# Patient Record
Sex: Female | Born: 1980 | Race: White | Hispanic: No | Marital: Married | Smoking: Never smoker
Health system: Southern US, Community
[De-identification: ages and names within clinical notes are randomized; demographics above are authoritative.]

## PROBLEM LIST (undated history)

## (undated) DIAGNOSIS — I10 Essential (primary) hypertension: Secondary | ICD-10-CM

## (undated) DIAGNOSIS — IMO0002 Reserved for concepts with insufficient information to code with codable children: Secondary | ICD-10-CM

## (undated) DIAGNOSIS — A630 Anogenital (venereal) warts: Secondary | ICD-10-CM

## (undated) HISTORY — DX: Anogenital (venereal) warts: A63.0

## (undated) HISTORY — DX: Reserved for concepts with insufficient information to code with codable children: IMO0002

## (undated) HISTORY — PX: COLPOSCOPY: SHX161

## (undated) HISTORY — DX: Essential (primary) hypertension: I10

---

## 2000-06-03 HISTORY — PX: TONSILLECTOMY: SHX5217

## 2003-12-16 ENCOUNTER — Other Ambulatory Visit: Admission: RE | Admit: 2003-12-16 | Discharge: 2003-12-16 | Payer: Self-pay | Admitting: Obstetrics and Gynecology

## 2005-01-24 ENCOUNTER — Other Ambulatory Visit: Admission: RE | Admit: 2005-01-24 | Discharge: 2005-01-24 | Payer: Self-pay | Admitting: Obstetrics and Gynecology

## 2005-06-03 DIAGNOSIS — IMO0002 Reserved for concepts with insufficient information to code with codable children: Secondary | ICD-10-CM

## 2005-06-03 HISTORY — DX: Reserved for concepts with insufficient information to code with codable children: IMO0002

## 2006-01-27 ENCOUNTER — Other Ambulatory Visit: Admission: RE | Admit: 2006-01-27 | Discharge: 2006-01-27 | Payer: Self-pay | Admitting: Obstetrics and Gynecology

## 2006-05-14 ENCOUNTER — Emergency Department (HOSPITAL_COMMUNITY): Admission: EM | Admit: 2006-05-14 | Discharge: 2006-05-14 | Payer: Self-pay | Admitting: *Deleted

## 2006-08-11 ENCOUNTER — Other Ambulatory Visit: Admission: RE | Admit: 2006-08-11 | Discharge: 2006-08-11 | Payer: Self-pay | Admitting: Obstetrics & Gynecology

## 2007-02-17 ENCOUNTER — Other Ambulatory Visit: Admission: RE | Admit: 2007-02-17 | Discharge: 2007-02-17 | Payer: Self-pay | Admitting: Obstetrics and Gynecology

## 2007-08-06 ENCOUNTER — Other Ambulatory Visit: Admission: RE | Admit: 2007-08-06 | Discharge: 2007-08-06 | Payer: Self-pay | Admitting: Obstetrics and Gynecology

## 2008-02-18 ENCOUNTER — Other Ambulatory Visit: Admission: RE | Admit: 2008-02-18 | Discharge: 2008-02-18 | Payer: Self-pay | Admitting: Obstetrics and Gynecology

## 2012-05-03 HISTORY — PX: INTRAUTERINE DEVICE (IUD) INSERTION: SHX5877

## 2013-05-24 ENCOUNTER — Encounter: Payer: Self-pay | Admitting: Nurse Practitioner

## 2013-05-24 ENCOUNTER — Ambulatory Visit (INDEPENDENT_AMBULATORY_CARE_PROVIDER_SITE_OTHER): Payer: BC Managed Care – PPO | Admitting: Nurse Practitioner

## 2013-05-24 VITALS — BP 122/80 | HR 64 | Resp 16 | Ht 65.25 in | Wt 185.0 lb

## 2013-05-24 DIAGNOSIS — Z0000 Encounter for general adult medical examination without abnormal findings: Secondary | ICD-10-CM

## 2013-05-24 DIAGNOSIS — Z01419 Encounter for gynecological examination (general) (routine) without abnormal findings: Secondary | ICD-10-CM

## 2013-05-24 LAB — COMPREHENSIVE METABOLIC PANEL
ALT: 19 U/L (ref 0–35)
AST: 18 U/L (ref 0–37)
Alkaline Phosphatase: 75 U/L (ref 39–117)
BUN: 10 mg/dL (ref 6–23)
Calcium: 9.3 mg/dL (ref 8.4–10.5)
Creat: 0.77 mg/dL (ref 0.50–1.10)
Glucose, Bld: 93 mg/dL (ref 70–99)

## 2013-05-24 NOTE — Patient Instructions (Signed)

## 2013-05-24 NOTE — Progress Notes (Signed)
Patient ID: Jane Shaffer, female   DOB: 1981-05-07, 32 y.o.   MRN: [id number] 32 y.o. [id number] Married Caucasian Fe here for annual exam. Last GYN gave her the med's for HTN.  She is having very light spotting at times since Mirena IUD last December.  She was a patient here of Dr. Candis Musa then went to [facility] to have her children.  She has a daughter who is 4 and during that pregnancy was started on HTN treatment.  After that pregnancy had to continue with BP med's. She then got pregnant again and has a son that is now 60 1/2 years old.  He has a lot of developmental delays and he is being studied at different places because he does not fit into any one criteria - closest to CP.  He has a trache and requires 24/7 care.    Patient's last menstrual period was 05/20/2013.          Sexually active: yes  The current method of family planning is IUD - Mirena Exercising: yes  Home exercise routine includes jogging 4-5 times per week. Smoker:  no  Health Maintenance: Pap:  2012, WNL.  History of abnormal with HPV, but no abnormal pap in last 6-7 years.  Abnormal records in our old chart. TDaP:  2009 Labs: CMP   reports that she has never smoked. She has never used smokeless tobacco. She reports that she drinks about 1.0 ounces of alcohol per week. She reports that she does not use illicit drugs.  Past Medical History  Diagnosis Date  . Hypertension   . Genital warts   . Abnormal Pap smear     with HPV, none in last 6-7 years    Past Surgical History  Procedure Laterality Date  . Colposcopy    . Tonsillectomy  2002    Current Outpatient Prescriptions  Medication Sig Dispense Refill  . benzonatate (TESSALON) 100 MG capsule Take 1 capsule by mouth as needed. For cough      . Cholecalciferol (VITAMIN D) 2000 UNITS CAPS Take 1 capsule by mouth daily.      Marland Kitchen lisinopril-hydrochlorothiazide (PRINZIDE,ZESTORETIC) 10-12.5 MG per tablet Take 1 tablet by mouth daily.       No current  facility-administered medications for this visit.    Family History  Problem Relation Age of Onset  . Diabetes Father 56    Juvenile  . Diabetes Paternal Uncle 3    juvenile  . Breast cancer Paternal Grandmother     60's or 70's    ROS:  Pertinent items are noted in HPI.  Otherwise, a comprehensive ROS was negative.  Exam:   BP 122/80  Pulse 64  Resp 16  Ht 5' 5.25" (1.657 m)  Wt 185 lb (83.915 kg)  BMI 30.56 kg/m2  LMP 05/20/2013 Height: 5' 5.25" (165.7 cm)  Ht Readings from Last 3 Encounters:  05/24/13 5' 5.25" (1.657 m)    General appearance: alert, cooperative and appears stated age Head: Normocephalic, without obvious abnormality, atraumatic Neck: no adenopathy, supple, symmetrical, trachea midline and thyroid normal to inspection and palpation Lungs: clear to auscultation bilaterally Breasts: normal appearance, no masses or tenderness Heart: regular rate and rhythm Abdomen: soft, non-tender; no masses,  no organomegaly Extremities: extremities normal, atraumatic, no cyanosis or edema Skin: Skin color, texture, turgor normal. No rashes or lesions Lymph nodes: Cervical, supraclavicular, and axillary nodes normal. No abnormal inguinal nodes palpated Neurologic: Grossly normal   Pelvic: External genitalia:  no lesions  Urethra:  normal appearing urethra with no masses, tenderness or lesions              Bartholin's and Skene's: normal                 Vagina: normal appearing vagina with normal color and discharge, no lesions              Cervix: anteverted IUD strings are visible              Pap taken: yes Bimanual Exam:  Uterus:  normal size, contour, position, consistency, mobility, non-tender              Adnexa: no mass, fullness, tenderness               Rectovaginal: Confirms               Anus:  normal sphincter tone, no lesions  A:  Well Woman with normal exam  Mirena IUD for contraception  Previous history of CIN I about 7 years  ago  HTN  P:   Pap smear as per guidelines done today   Counseled on breast self exam, adequate intake of calcium and vitamin D, diet and exercise return annually or prn  An After Visit Summary was printed and given to the patient.

## 2013-05-26 NOTE — Progress Notes (Signed)
Reviewed personally.  M. Suzanne Keirstyn Aydt, MD.  

## 2013-06-02 ENCOUNTER — Encounter: Payer: Self-pay | Admitting: Nurse Practitioner

## 2014-04-04 ENCOUNTER — Encounter: Payer: Self-pay | Admitting: Nurse Practitioner

## 2016-09-30 ENCOUNTER — Ambulatory Visit (INDEPENDENT_AMBULATORY_CARE_PROVIDER_SITE_OTHER): Payer: BC Managed Care – PPO | Admitting: Nurse Practitioner

## 2016-09-30 ENCOUNTER — Encounter: Payer: Self-pay | Admitting: Nurse Practitioner

## 2016-09-30 VITALS — BP 126/84 | HR 80 | Ht 65.0 in | Wt 169.0 lb

## 2016-09-30 DIAGNOSIS — Z01411 Encounter for gynecological examination (general) (routine) with abnormal findings: Secondary | ICD-10-CM | POA: Diagnosis not present

## 2016-09-30 DIAGNOSIS — Z975 Presence of (intrauterine) contraceptive device: Secondary | ICD-10-CM | POA: Diagnosis not present

## 2016-09-30 DIAGNOSIS — T8332XA Displacement of intrauterine contraceptive device, initial encounter: Secondary | ICD-10-CM

## 2016-09-30 DIAGNOSIS — Z0000 Encounter for general adult medical examination without abnormal findings: Secondary | ICD-10-CM

## 2016-09-30 NOTE — Progress Notes (Signed)
36 y.o. [id number] Married  Caucasian Fe here for annual exam.  She is a former pt just over 3 yrs since last seen.  Menses now at 2 days of spotting monthly while having the Mirena IUD.Marland Kitchen No PMS, slight bloating, no cramps. Wants another Mirena IUD after this one.  She continues to teach high school.  Update about son:  Hospital in [place], [place] has found that her son symptoms fits closest to CP. He has suffered a fracture of the femur.  Still has tracheostomy.  He had a bad infection after the fracture and became septic.  Muscle movements are spastic and does not talk. Still requires 24/7 care.  He is able to be in school with a nurse present.  Note from 05/24/2013:  (Last GYN gave her the med's for HTN.  She is having very light spotting at times since Mirena IUD last December.  She was a patient here of Dr. Candis Musa then went to [facility] to have her children.  She has a daughter who is 4 and during that pregnancy was started on HTN treatment.  After that pregnancy had to continue with BP med's. She then got pregnant again and has a son that is now 17 1/2 years old.  He has a lot of developmental delays and he is being studied at different places because he does not fit into any one criteria - closest to CP.  He has a trache and requires 24/7 care. )    Patient's last menstrual period was 09/29/2016 (exact date).          Sexually active: Yes.    The current method of family planning is IUD. Mirena placed 05/2012. Exercising: Yes.    yoga, running and pilates. Smoker:  no  Health Maintenance: Pap: 05/24/13, Negative with neg HR HPV  2012, Negative History of Abnormal Pap: yes, History of abnormal with HPV, but no abnormal pap since 2007.  Abnormal records in paper chart. Self Breast exams: yes TDaP: 2009 HIV: 2010 with pregnancy Labs: PCP takes care of all labs   reports that she has never smoked. She has never used smokeless tobacco. She reports that she drinks about 1.0 oz of alcohol per week . She  reports that she does not use drugs.  Past Medical History:  Diagnosis Date  . 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 Genital warts   . Hypertension     Past Surgical History:  Procedure Laterality Date  . COLPOSCOPY    . INTRAUTERINE DEVICE (IUD) INSERTION  05/2012   Mirena  . TONSILLECTOMY  2002    Current Outpatient Prescriptions  Medication Sig Dispense Refill  . Cholecalciferol (VITAMIN D) 2000 UNITS CAPS Take 1 capsule by mouth daily.    Marland Kitchen levonorgestrel (MIRENA) 20 MCG/24HR IUD by Intrauterine route.    Marland Kitchen lisinopril-hydrochlorothiazide (PRINZIDE,ZESTORETIC) 10-12.5 MG per tablet Take 1 tablet by mouth daily.     No current facility-administered medications for this visit.     Family History  Problem Relation Age of Onset  . Diabetes Father 34    Juvenile  . Hyperlipidemia Father   . Hypertension Father   . Breast cancer Paternal Grandmother     10's or 56's  . Hyperlipidemia Brother   . Hypertension Brother   . Hyperlipidemia Brother   . Diabetes Paternal Uncle 3    juvenile    ROS:  Pertinent items are noted in HPI.  Otherwise, a comprehensive ROS was negative.  Exam:  BP 126/84 (BP Location: Right Arm, Patient Position: Sitting, Cuff Size: Normal)   Pulse 80   Ht  (1.651 m)   Wt 169 lb (76.7 kg)   LMP 09/29/2016 (Exact Date) Comment: spotting x 1 day  BMI 28.12 kg/m  Height:  (165.1 cm) Ht Readings from Last 3 Encounters:  09/30/16  (1.651 m)  05/24/13 5' 5.25" (1.657 m)    General appearance: alert, cooperative and appears stated age Head: Normocephalic, without obvious abnormality, atraumatic Neck: no adenopathy, supple, symmetrical, trachea midline and thyroid normal to inspection and palpation Lungs: clear to auscultation bilaterally Breasts: normal appearance, no masses or tenderness Heart: regular rate and rhythm Abdomen: soft, non-tender; no masses,  no organomegaly Extremities: extremities normal, atraumatic, no cyanosis  or edema Skin: Skin color, texture, turgor normal. No rashes or lesions Lymph nodes: Cervical, supraclavicular, and axillary nodes normal. No abnormal inguinal nodes palpated Neurologic: Grossly normal   Pelvic: External genitalia:  no lesions              Urethra:  normal appearing urethra with no masses, tenderness or lesions              Bartholin's and Skene's: normal                 Vagina: normal appearing vagina with normal color and discharge, no lesions              Cervix: anteverted              Pap taken: Yes.   Bimanual Exam:  Uterus:  normal size, contour, position, consistency, mobility, non-tender              Adnexa: no mass, fullness, tenderness               Rectovaginal: Confirms               Anus:  normal sphincter tone, no lesions  Chaperone present: yes  A:  Well Woman with normal exam   Mirena IUD for contraception - unable to find IUD strings             Previous history of CIN I remote past ~ 2007             HTN  Son with developmental problems and requires 24/7 care   P:   Reviewed health and wellness pertinent to exam  Pap smear: yes  Will get PUS to look for IUD strings - may end up changing IUD earlier  Counseled on breast self exam, adequate intake of calcium and vitamin D, diet and exercise return annually or prn  An After Visit Summary was printed and given to the patient.

## 2016-09-30 NOTE — Patient Instructions (Signed)

## 2016-09-30 NOTE — Progress Notes (Signed)
Encounter reviewed by Dr. Brook Amundson C. Silva.  

## 2016-10-01 NOTE — Addendum Note (Signed)
Addended by: Ria Comment R on: 10/01/2016 04:14 PM   Modules accepted: Orders

## 2016-10-02 LAB — IPS PAP TEST WITH HPV

## 2016-11-15 ENCOUNTER — Telehealth: Payer: Self-pay | Admitting: Nurse Practitioner

## 2016-11-15 NOTE — Telephone Encounter (Signed)
Called patient to review benefits for a recommended procedure. Left Voicemail requesting a call back. °

## 2016-12-09 NOTE — Telephone Encounter (Signed)
Thank you for the update. You may close the encounter. 

## 2016-12-09 NOTE — Telephone Encounter (Signed)
Patient returned call. Acknowledges previous message. Patient ready to proceed with scheduling ultrasound for IUD removal and reinsertion of Mirena IUD.  Patient scheduled 7/12 with Dr Edward JollySilva. Patient aware of date, arrival time and cancellation policy. No further questions.   Routing to Dr Edward JollySilva

## 2016-12-18 ENCOUNTER — Other Ambulatory Visit: Payer: Self-pay

## 2016-12-18 DIAGNOSIS — Z30433 Encounter for removal and reinsertion of intrauterine contraceptive device: Secondary | ICD-10-CM

## 2016-12-19 ENCOUNTER — Encounter: Payer: Self-pay | Admitting: Obstetrics and Gynecology

## 2016-12-19 ENCOUNTER — Ambulatory Visit (INDEPENDENT_AMBULATORY_CARE_PROVIDER_SITE_OTHER): Payer: BC Managed Care – PPO | Admitting: Obstetrics and Gynecology

## 2016-12-19 ENCOUNTER — Ambulatory Visit (INDEPENDENT_AMBULATORY_CARE_PROVIDER_SITE_OTHER): Payer: BC Managed Care – PPO

## 2016-12-19 DIAGNOSIS — Z30433 Encounter for removal and reinsertion of intrauterine contraceptive device: Secondary | ICD-10-CM

## 2016-12-19 NOTE — Patient Instructions (Signed)

## 2016-12-19 NOTE — Progress Notes (Signed)
GYNECOLOGY  VISIT   HPI: 36 y.o.   Married  Caucasian  female   [phone] with Patient's last menstrual period was 12/04/2016 (approximate).   here for   Ultrasound for IUD location and removal/reinsertion of new Mirena IUD.   This IUD is not expired yet.  No cycles for the last 5 years.  Using Mirena for pregnancy prevention.   GYNECOLOGIC HISTORY: Patient's last menstrual period was 12/04/2016 (approximate). Contraception:  Mirena.  Menopausal hormone therapy: NA Last mammogram:  NA Last pap smear:  10/01/16 - normal and neg HR HPV.         OB History    Gravida Para Term Preterm AB Living   3 2 2  0 1 2   SAB TAB Ectopic Multiple Live Births   1 0 0 0 2         There are no active problems to display for this patient.   Past Medical History:  Diagnosis Date  . 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 Genital warts   . Hypertension     Past Surgical History:  Procedure Laterality Date  . COLPOSCOPY    . INTRAUTERINE DEVICE (IUD) INSERTION  05/2012   Mirena  . TONSILLECTOMY  2002    Current Outpatient Prescriptions  Medication Sig Dispense Refill  . Cholecalciferol (VITAMIN D) 2000 UNITS CAPS Take 1 capsule by mouth daily.    Marland Kitchen. levonorgestrel (MIRENA) 20 MCG/24HR IUD by Intrauterine route.    Marland Kitchen. lisinopril-hydrochlorothiazide (PRINZIDE,ZESTORETIC) 10-12.5 MG per tablet Take 1 tablet by mouth daily.     No current facility-administered medications for this visit.      ALLERGIES: Patient has no known allergies.  Family History  Problem Relation Age of Onset  . Diabetes Father 6512       Juvenile  . Hyperlipidemia Father   . Hypertension Father   . Breast cancer Paternal Grandmother        460's or 6270's  . Hyperlipidemia Brother   . Hypertension Brother   . Hyperlipidemia Brother   . Diabetes Paternal Uncle 3       juvenile    Social History   Social History  . Marital status: Married    Spouse name: N/A  . Number of children: 2  . Years of education:  N/A   Occupational History  . Not on file.   Social History Main Topics  . Smoking status: Never Smoker  . Smokeless tobacco: Never Used  . Alcohol use 1.0 oz/week    2 Standard drinks or equivalent per week  . Drug use: No  . Sexual activity: Yes    Partners: Male    Birth control/ protection: IUD     Comment: Mirena inserted 12/19/17   Other Topics Concern  . Not on file   Social History Narrative  . No narrative on file    ROS:  Pertinent items are noted in HPI.  PHYSICAL EXAMINATION:    BP (!) 100/56 (BP Location: Right Arm, Patient Position: Sitting, Cuff Size: Normal)   Pulse 74   Resp 12   Ht 5\' 5"  (1.651 m)   Wt 173 lb (78.5 kg)   LMP 12/04/2016 (Approximate)   BMI 28.79 kg/m     General appearance: alert, cooperative and appears stated age   Pelvic: External genitalia:  no lesions              Urethra:  normal appearing urethra with no masses, tenderness or lesions  Bartholins and Skenes: normal                 Vagina: normal appearing vagina with normal color and discharge, no lesions              Cervix: no lesions                Bimanual Exam:  Uterus:  normal size, contour, position, consistency, mobility, non-tender              Adnexa: no mass, fullness, tenderness       Ultrasound today: IUD in endometrial canal.   Consent for IUD removal and reinsertion of new Mirena IUD.     Mirena IUD lot number [id number], expiration Jan 2021. Sterile prep with Hibiclens.  Tenaculum to anterior cervical lip.  Dressing forceps used to remove IUD intact, shown to patient, and discarded.  Uterus sounded to 8.5 cm.  Felt like it went into the right uterine horn.  No blood noted.  Mirena IUD placed to 8 cm.  Strings trimmed to 2 cm.  Pelvic US confirms position afterward. Tolerated well. No complications.   Chaperone was present for exam.  ASSESSMENT  Mirena IUD removal and reinsertion with US guidance.   PLAN  Use back up contraception for  1 week.  Instructions and precautions given.  Follow up in 4 weeks.   An After Visit Summary was printed and given to the patient.

## 2017-01-16 ENCOUNTER — Telehealth: Payer: Self-pay | Admitting: *Deleted

## 2017-01-16 NOTE — Progress Notes (Deleted)
GYNECOLOGY  VISIT   HPI: 36 y.o.   Married  Caucasian  female   [phone] with No LMP recorded. Patient is not currently having periods (Reason: IUD).   here for follow up after Mirena IUD insertion.   GYNECOLOGIC HISTORY: No LMP recorded. Patient is not currently having periods (Reason: IUD). Contraception:  Mirena IUD inserted 12-19-16 Menopausal hormone therapy:  n/a Last mammogram:  n/a Last pap smear: 10/01/16 - normal and neg HR HPV.         OB History    Gravida Para Term Preterm AB Living   3 2 2  0 1 2   SAB TAB Ectopic Multiple Live Births   1 0 0 0 2         There are no active problems to display for this patient.   Past Medical History:  Diagnosis Date  . 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 Genital warts   . Hypertension     Past Surgical History:  Procedure Laterality Date  . COLPOSCOPY    . INTRAUTERINE DEVICE (IUD) INSERTION  05/2012   Mirena  . TONSILLECTOMY  2002    Current Outpatient Prescriptions  Medication Sig Dispense Refill  . Cholecalciferol (VITAMIN D) 2000 UNITS CAPS Take 1 capsule by mouth daily.    Marland Kitchen. levonorgestrel (MIRENA) 20 MCG/24HR IUD by Intrauterine route.    Marland Kitchen. lisinopril-hydrochlorothiazide (PRINZIDE,ZESTORETIC) 10-12.5 MG per tablet Take 1 tablet by mouth daily.     No current facility-administered medications for this visit.      ALLERGIES: Patient has no known allergies.  Family History  Problem Relation Age of Onset  . Diabetes Father 6812       Juvenile  . Hyperlipidemia Father   . Hypertension Father   . Breast cancer Paternal Grandmother        4860's or 9170's  . Hyperlipidemia Brother   . Hypertension Brother   . Hyperlipidemia Brother   . Diabetes Paternal Uncle 3       juvenile    Social History   Social History  . Marital status: Married    Spouse name: N/A  . Number of children: 2  . Years of education: N/A   Occupational History  . Not on file.   Social History Main Topics  . Smoking status: Never  Smoker  . Smokeless tobacco: Never Used  . Alcohol use 1.0 oz/week    2 Standard drinks or equivalent per week  . Drug use: No  . Sexual activity: Yes    Partners: Male    Birth control/ protection: IUD     Comment: Mirena inserted 12/19/17   Other Topics Concern  . Not on file   Social History Narrative  . No narrative on file    ROS:  Pertinent items are noted in HPI.  PHYSICAL EXAMINATION:    There were no vitals taken for this visit.    General appearance: alert, cooperative and appears stated age Head: Normocephalic, without obvious abnormality, atraumatic Neck: no adenopathy, supple, symmetrical, trachea midline and thyroid normal to inspection and palpation Lungs: clear to auscultation bilaterally Breasts: normal appearance, no masses or tenderness, No nipple retraction or dimpling, No nipple discharge or bleeding, No axillary or supraclavicular adenopathy Heart: regular rate and rhythm Abdomen: soft, non-tender, no masses,  no organomegaly Extremities: extremities normal, atraumatic, no cyanosis or edema Skin: Skin color, texture, turgor normal. No rashes or lesions Lymph nodes: Cervical, supraclavicular, and axillary nodes normal. No abnormal inguinal  nodes palpated Neurologic: Grossly normal  Pelvic: External genitalia:  no lesions              Urethra:  normal appearing urethra with no masses, tenderness or lesions              Bartholins and Skenes: normal                 Vagina: normal appearing vagina with normal color and discharge, no lesions              Cervix: no lesions                Bimanual Exam:  Uterus:  normal size, contour, position, consistency, mobility, non-tender              Adnexa: no mass, fullness, tenderness              Rectal exam: {yes no:314532}.  Confirms.              Anus:  normal sphincter tone, no lesions  Chaperone was present for exam.  ASSESSMENT     PLAN     An After Visit Summary was printed and given to the  patient.  ______ minutes face to face time of which over 50% was spent in counseling.

## 2017-01-16 NOTE — Telephone Encounter (Signed)
Call to patient. Left message to cancel appointment in am due to provider family emergency. Left message to call back in am to reschedule.

## 2017-01-17 ENCOUNTER — Ambulatory Visit: Payer: BC Managed Care – PPO | Admitting: Obstetrics and Gynecology

## 2017-01-21 ENCOUNTER — Encounter: Payer: Self-pay | Admitting: Obstetrics and Gynecology

## 2017-01-21 ENCOUNTER — Ambulatory Visit (INDEPENDENT_AMBULATORY_CARE_PROVIDER_SITE_OTHER): Payer: BC Managed Care – PPO | Admitting: Obstetrics and Gynecology

## 2017-01-21 VITALS — BP 138/76 | HR 92 | Resp 16 | Ht 65.0 in | Wt 175.0 lb

## 2017-01-21 DIAGNOSIS — Z30431 Encounter for routine checking of intrauterine contraceptive device: Secondary | ICD-10-CM | POA: Diagnosis not present

## 2017-01-21 NOTE — Progress Notes (Signed)
GYNECOLOGY  VISIT   HPI: 36 y.o.   Married  Caucasian  female   [phone] with No LMP recorded. Patient is not currently having periods (Reason: IUD).   here for 1 month IUD check.  Had Mirena IUD exchange on 12/19/16. Really likes the IUD.  No bleeding or cramping.  Sexual activity is not painful.  Has not checked her IUD strings.   The last time she had the Mirena, the strings got too short.   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   GYNECOLOGIC HISTORY: No LMP recorded. Patient is not currently having periods (Reason: IUD). Contraception:  Mirena IUD Menopausal hormone therapy:  none Last mammogram:  none Last pap smear:   11/01/16 Neg. HR HPV:neg         OB History    Gravida Para Term Preterm AB Living   3 2 2  0 1 2   SAB TAB Ectopic Multiple Live Births   1 0 0 0 2         There are no active problems to display for this patient.   Past Medical History:  Diagnosis Date  . 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 Genital warts   . Hypertension     Past Surgical History:  Procedure Laterality Date  . COLPOSCOPY    . INTRAUTERINE DEVICE (IUD) INSERTION  05/2012   Mirena  . TONSILLECTOMY  2002    Current Outpatient Prescriptions  Medication Sig Dispense Refill  . Cholecalciferol (VITAMIN D) 2000 UNITS CAPS Take 1 capsule by mouth daily.    Marland Kitchen levonorgestrel (MIRENA) 20 MCG/24HR IUD by Intrauterine route.    Marland Kitchen lisinopril-hydrochlorothiazide (PRINZIDE,ZESTORETIC) 10-12.5 MG per tablet Take 1 tablet by mouth daily.     No current facility-administered medications for this visit.      ALLERGIES: Patient has no known allergies.  Family History  Problem Relation Age of Onset  . Diabetes Father 75       Juvenile  . Hyperlipidemia Father   . Hypertension Father   . Breast cancer Paternal Grandmother        25's or 24's  . Hyperlipidemia Brother   . Hypertension Brother   . Hyperlipidemia Brother   . Diabetes Paternal Uncle  3       juvenile    Social History   Social History  . Marital status: Married    Spouse name: N/A  . Number of children: 2  . Years of education: N/A   Occupational History  . Not on file.   Social History Main Topics  . Smoking status: Never Smoker  . Smokeless tobacco: Never Used  . Alcohol use 1.0 oz/week    2 Standard drinks or equivalent per week  . Drug use: No  . Sexual activity: Yes    Partners: Male    Birth control/ protection: IUD     Comment: Mirena inserted 12/19/17   Other Topics Concern  . Not on file   Social History Narrative  . No narrative on file    ROS:  Pertinent items are noted in HPI.  PHYSICAL EXAMINATION:    BP 138/76 (BP Location: Right Arm, Patient Position: Sitting, Cuff Size: Normal)   Pulse 92   Resp 16   Ht 5\' 5"  (1.651 m)   Wt 175 lb (79.4 kg)   BMI 29.12 kg/m     General appearance: alert, cooperative and appears stated age  Pelvic: External genitalia:  no lesions              Urethra:  normal appearing urethra with no masses, tenderness or lesions              Bartholins and Skenes: normal                 Vagina: normal appearing vagina with normal color and discharge, no lesions              Cervix: no lesions.  IUD strings seen.  About 2.5 - 3 cm length.                Bimanual Exam:  Uterus:  normal size, contour, position, consistency, mobility, non-tender              Adnexa: no mass, fullness, tenderness             Chaperone was present for exam.  ASSESSMENT  Status post Mirena IUD exchange.  Doing well.   PLAN  Follow up for routine annual exams and prn.  Good luck with school starting next week!   An After Visit Summary was printed and given to the patient.  __15____ minutes face to face time of which over 50% was spent in counseling.

## 2017-01-27 NOTE — Telephone Encounter (Signed)
Appointment completed on 01-21-17.  Routing to provider for final review.  Will close encounter.

## 2017-10-03 ENCOUNTER — Ambulatory Visit: Payer: BC Managed Care – PPO | Admitting: Nurse Practitioner

## 2017-10-10 ENCOUNTER — Ambulatory Visit: Payer: BC Managed Care – PPO | Admitting: Obstetrics and Gynecology

## 2019-11-10 ENCOUNTER — Other Ambulatory Visit: Payer: Self-pay

## 2019-11-11 ENCOUNTER — Encounter: Payer: Self-pay | Admitting: Obstetrics and Gynecology

## 2019-11-11 ENCOUNTER — Ambulatory Visit: Payer: BC Managed Care – PPO | Admitting: Obstetrics and Gynecology

## 2019-11-11 VITALS — BP 140/76 | HR 90 | Temp 97.7°F | Resp 14 | Ht 65.5 in | Wt 189.0 lb

## 2019-11-11 DIAGNOSIS — Z01419 Encounter for gynecological examination (general) (routine) without abnormal findings: Secondary | ICD-10-CM

## 2019-11-11 NOTE — Patient Instructions (Signed)

## 2019-11-11 NOTE — Progress Notes (Signed)
39 y.o. G69P2012 Married Caucasian female here for annual exam.    No menses with Mirena.  Has spotting every other month.  Feels hot at night.  Takes Melatonin to help with sleep.   Has a 71 yo son who requires 24 hour care at home.  He has Joellyn Quails Syndrome. Her daughter is 66 yo. 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   She received her Covid vaccine.   PCP:   Daylene Katayama, PA  No LMP recorded. (Menstrual status: IUD).     Period Cycle (Days):  (no cycles with IUD)      Sexually active: Yes.    The current method of family planning is IUD--Mirena IUD 12-19-16    Exercising: Yes.    work out videos, walking,jogging Smoker:  no  Health Maintenance: Pap: 10-01-16 Neg:Neg HR HPV, 05-24-13 Neg:Neg HR HPV, 2012 Neg History of abnormal Pap:  Yes, History of abnormal with HPV, but no abnormal pap since 2007.  Abnormal records in paper chart. MMG:  n/a Colonoscopy:  2018 normal--brother with polyps;next due 5 years BMD:   n/a  Result  n/a TDaP: Td 06-25-17 Gardasil:   yes HIV: Neg with preg.2010 Hep C:  Donated blood in the past.  Screening Labs:  PCP.   reports that she has never smoked. She has never used smokeless tobacco. She reports current alcohol use of about 2.0 standard drinks of alcohol per week. She reports that she does not use drugs.  Past Medical History:  Diagnosis Date  . 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 Genital warts   . Hypertension     Past Surgical History:  Procedure Laterality Date  . COLPOSCOPY    . INTRAUTERINE DEVICE (IUD) INSERTION  05/2012   Mirena  . TONSILLECTOMY  2002    Current Outpatient Medications  Medication Sig Dispense Refill  . Cholecalciferol (VITAMIN D) 2000 UNITS CAPS Take 1 capsule by mouth daily.    Marland Kitchen levonorgestrel (MIRENA) 20 MCG/24HR IUD by Intrauterine route.    Marland Kitchen lisinopril-hydrochlorothiazide (PRINZIDE,ZESTORETIC) 10-12.5 MG per tablet Take 1 tablet by mouth daily.     No current facility-administered medications  for this visit.    Family History  Problem Relation Age of Onset  . Diabetes Father 30       Juvenile  . Hyperlipidemia Father   . Hypertension Father   . Breast cancer Paternal Grandmother        19's or 35's  . Hyperlipidemia Brother   . Hypertension Brother   . Hyperlipidemia Brother   . Diabetes Paternal Uncle 3       juvenile    Review of Systems  All other systems reviewed and are negative.   Exam:   BP 140/76   Pulse 90   Temp 97.7 F (36.5 C) (Temporal)   Resp 14   Ht 5' 5.5" (1.664 m)   Wt 189 lb (85.7 kg)   BMI 30.97 kg/m     General appearance: alert, cooperative and appears stated age Head: normocephalic, without obvious abnormality, atraumatic Neck: no adenopathy, supple, symmetrical, trachea midline and thyroid normal to inspection and palpation Lungs: clear to auscultation bilaterally Breasts: normal appearance, no masses or tenderness, No nipple retraction or dimpling, No nipple discharge or bleeding, No axillary adenopathy Heart: regular rate and rhythm Abdomen: soft, non-tender; no masses, no organomegaly Extremities: extremities normal, atraumatic, no cyanosis or edema Skin: skin color, texture, turgor normal. No rashes or lesions Lymph nodes: cervical, supraclavicular, and  axillary nodes normal. Neurologic: grossly normal  Pelvic: External genitalia:  no lesions              No abnormal inguinal nodes palpated.              Urethra:  normal appearing urethra with no masses, tenderness or lesions              Bartholins and Skenes: normal                 Vagina: normal appearing vagina with normal color and discharge, no lesions              Cervix: no lesions.  IUD strings noted.               Pap taken: No. Bimanual Exam:  Uterus:  normal size, contour, position, consistency, mobility, non-tender              Adnexa: no mass, fullness, tenderness             Chaperone was present for exam.  Assessment:   Well woman visit with normal  exam. Mirena IUD.  Hx CIN I around 2007. Caregiver status.  Plan: Mammogram screening discussed. Self breast awareness reviewed. Pap and HR HPV as above.  New guidelines discussed. Guidelines for Calcium, Vitamin D, regular exercise program including cardiovascular and weight bearing exercise. Discussed colonoscopy. Check FSH, E2, and TSH.  Follow up annually and prn.   After visit summary provided.

## 2019-11-12 LAB — ESTRADIOL: Estradiol: 49.9 pg/mL

## 2019-11-12 LAB — TSH: TSH: 1.14 u[IU]/mL (ref 0.450–4.500)

## 2019-11-12 LAB — FOLLICLE STIMULATING HORMONE: FSH: 6.2 m[IU]/mL

## 2020-10-31 NOTE — Progress Notes (Signed)
40 y.o. G63P2012 Married Caucasian female here for annual exam.    Had a recent UTI and took Macrobid.   Having more menstrual flow now.  Her daughter has started having her period.   Received her Covid booster. Has a special needs son.   PCP:  Fae Pippin, PA  No LMP recorded. (Menstrual status: IUD).           Sexually active: Yes The current method of family planning is IUD- Mirena IUD 12/19/2016  Exercising: Yes, work out videos, walking/jogging Smoker: No  Health Maintenance: Pap:  10-01-16 Neg:Neg HR HPV, 05-24-13 Neg:Neg HR HPV, 2012 Neg  History of abnormal Pap: Yes, History of abnormal with HPV, but no abnormal pap since 2007.  Abnormal records in paper chart. MMG: Not yet Colonoscopy:   2018 normal--brother with polyps;next due 5 years TDaP: 06/15/2017 Gardasil:  Yes HIV: Neg with preg. 2010 Hep C: Screening Labs:  PCP   reports that she has never smoked. She has never used smokeless tobacco. She reports current alcohol use of about 6.0 standard drinks of alcohol per week. She reports that she does not use drugs.  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     levonorgestrel (MIRENA) 20 MCG/24HR IUD by Intrauterine route.     lisinopril-hydrochlorothiazide (PRINZIDE,ZESTORETIC) 10-12.5 MG per tablet Take 1 tablet by mouth daily.     No current facility-administered medications for this visit.    Family History  Problem Relation Age of Onset   Diabetes Father 20       Juvenile   Hyperlipidemia Father    Hypertension Father    Breast cancer Paternal Grandmother        19's or 54's   Hyperlipidemia  Brother    Hypertension Brother    Hyperlipidemia Brother    Diabetes Paternal Uncle 3       juvenile    Review of Systems  Constitutional: Negative.   HENT: Negative.    Eyes: Negative.   Respiratory: Negative.    Cardiovascular: Negative.   Gastrointestinal: Negative.   Endocrine: Negative.   Genitourinary: Negative.   Musculoskeletal: Negative.   Skin: Negative.   Allergic/Immunologic: Negative.   Neurological: Negative.   Hematological: Negative.    Exam:   BP 136/84   Pulse 87   Ht 5\' 5"  (1.651 m)   Wt 190 lb (86.2 kg)   BMI 31.62 kg/m     General appearance: alert, cooperative and appears stated age Head: normocephalic, without obvious abnormality, atraumatic Neck: no adenopathy, supple, symmetrical, trachea midline and thyroid normal to inspection and palpation Lungs: clear to auscultation bilaterally Breasts: normal appearance, no masses or tenderness, No nipple retraction or dimpling, No nipple discharge or bleeding, No axillary adenopathy Heart: regular rate and rhythm Abdomen: soft, non-tender; no masses, no organomegaly Extremities: extremities normal, atraumatic, no cyanosis or edema Skin: skin color, texture, turgor normal. No rashes or lesions Lymph nodes: cervical, supraclavicular, and axillary nodes normal. Neurologic: grossly normal  Pelvic: External genitalia:  no lesions  No abnormal inguinal nodes palpated.              Urethra:  normal appearing urethra with no masses, tenderness or lesions              Bartholins and Skenes: normal                 Vagina: normal appearing vagina with normal color and discharge, no lesions              Cervix: no lesions.  IUD strings noted.               Pap taken: No. Bimanual Exam:  Uterus:  normal size, contour, position, consistency, mobility, non-tender              Adnexa: no mass, fullness, tenderness              Rectal exam: Yes.  .  Confirms.              Anus:  normal sphincter tone, no  lesions  Chaperone was present for exam.  Assessment:   Well woman visit with normal exam. Mirena IUD. Hx CIN I around 2007. FH of colon polyps.  HTN. Caregiver status.  Plan: Mammogram screening discussed.  Information given.  She will schedule.  Self breast awareness reviewed. Pap and HR HPV 2025. Guidelines for Calcium, Vitamin D, regular exercise program including cardiovascular and weight bearing exercise. We discussed Mirena IUD being a 7 year IUD now.   Labs with PCP.  Follow up annually and prn.

## 2020-11-13 ENCOUNTER — Ambulatory Visit (INDEPENDENT_AMBULATORY_CARE_PROVIDER_SITE_OTHER): Payer: BC Managed Care – PPO | Admitting: Obstetrics and Gynecology

## 2020-11-13 ENCOUNTER — Other Ambulatory Visit: Payer: Self-pay

## 2020-11-13 ENCOUNTER — Encounter: Payer: Self-pay | Admitting: Obstetrics and Gynecology

## 2020-11-13 VITALS — BP 136/84 | HR 87 | Ht 65.0 in | Wt 190.0 lb

## 2020-11-13 DIAGNOSIS — Z01419 Encounter for gynecological examination (general) (routine) without abnormal findings: Secondary | ICD-10-CM | POA: Diagnosis not present

## 2020-11-13 NOTE — Patient Instructions (Signed)

## 2020-11-15 ENCOUNTER — Other Ambulatory Visit: Payer: Self-pay | Admitting: Obstetrics and Gynecology

## 2020-11-15 DIAGNOSIS — Z1231 Encounter for screening mammogram for malignant neoplasm of breast: Secondary | ICD-10-CM

## 2020-11-18 ENCOUNTER — Ambulatory Visit: Payer: Self-pay

## 2021-01-11 ENCOUNTER — Ambulatory Visit
Admission: RE | Admit: 2021-01-11 | Discharge: 2021-01-11 | Disposition: A | Discharge status: Home / Self Care | Payer: BC Managed Care – PPO | Source: Ambulatory Visit | Attending: Obstetrics and Gynecology | Admitting: Obstetrics and Gynecology

## 2021-01-11 ENCOUNTER — Other Ambulatory Visit: Payer: Self-pay

## 2021-01-11 DIAGNOSIS — Z1231 Encounter for screening mammogram for malignant neoplasm of breast: Secondary | ICD-10-CM

## 2021-01-15 ENCOUNTER — Other Ambulatory Visit: Payer: Self-pay | Admitting: Obstetrics and Gynecology

## 2021-01-15 DIAGNOSIS — R928 Other abnormal and inconclusive findings on diagnostic imaging of breast: Secondary | ICD-10-CM

## 2021-01-20 ENCOUNTER — Other Ambulatory Visit: Payer: Self-pay | Admitting: Obstetrics and Gynecology

## 2021-01-20 ENCOUNTER — Other Ambulatory Visit: Payer: Self-pay

## 2021-01-20 ENCOUNTER — Ambulatory Visit: Payer: BC Managed Care – PPO

## 2021-01-20 ENCOUNTER — Ambulatory Visit
Admission: RE | Admit: 2021-01-20 | Discharge: 2021-01-20 | Disposition: A | Discharge status: Home / Self Care | Payer: BC Managed Care – PPO | Source: Ambulatory Visit | Attending: Obstetrics and Gynecology | Admitting: Obstetrics and Gynecology

## 2021-01-20 DIAGNOSIS — R921 Mammographic calcification found on diagnostic imaging of breast: Secondary | ICD-10-CM

## 2021-01-20 DIAGNOSIS — R928 Other abnormal and inconclusive findings on diagnostic imaging of breast: Secondary | ICD-10-CM

## 2021-07-24 ENCOUNTER — Other Ambulatory Visit: Payer: Self-pay | Admitting: Obstetrics and Gynecology

## 2021-07-24 ENCOUNTER — Ambulatory Visit
Admission: RE | Admit: 2021-07-24 | Discharge: 2021-07-24 | Disposition: A | Discharge status: Home / Self Care | Payer: BC Managed Care – PPO | Source: Ambulatory Visit | Attending: Obstetrics and Gynecology | Admitting: Obstetrics and Gynecology

## 2021-07-24 DIAGNOSIS — R921 Mammographic calcification found on diagnostic imaging of breast: Secondary | ICD-10-CM

## 2021-11-19 ENCOUNTER — Encounter: Payer: Self-pay | Admitting: Obstetrics and Gynecology

## 2021-11-19 ENCOUNTER — Other Ambulatory Visit (HOSPITAL_COMMUNITY)
Admission: RE | Admit: 2021-11-19 | Discharge: 2021-11-19 | Disposition: A | Discharge status: Home / Self Care | Payer: BC Managed Care – PPO | Source: Ambulatory Visit | Attending: Obstetrics and Gynecology | Admitting: Obstetrics and Gynecology

## 2021-11-19 ENCOUNTER — Ambulatory Visit (INDEPENDENT_AMBULATORY_CARE_PROVIDER_SITE_OTHER): Payer: BC Managed Care – PPO | Admitting: Obstetrics and Gynecology

## 2021-11-19 VITALS — BP 130/74 | HR 96 | Ht 65.0 in | Wt 196.0 lb

## 2021-11-19 DIAGNOSIS — Z124 Encounter for screening for malignant neoplasm of cervix: Secondary | ICD-10-CM

## 2021-11-19 DIAGNOSIS — Z01419 Encounter for gynecological examination (general) (routine) without abnormal findings: Secondary | ICD-10-CM | POA: Diagnosis not present

## 2021-11-19 NOTE — Progress Notes (Signed)
41 y.o. G74P2012 Married Caucasian female here for annual exam.    Patient had body rash in January and had no specific diagnosis--saw dermatologist.  Occasional spotting with her Mirena IUD.  Having anal itching and bleeding.  Not straining to have a BM. She is due for a colonoscopy.   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   PCP:  Hiliary Gordnier, PA   No LMP recorded. (Menstrual status: IUD).--Mirena 12/19/16     Period Cycle (Days):  (no cycle with Mirena IUD)    Sexually active: Yes.    The current method of family planning is IUD.    Exercising: Yes.     Walking, some workout videos Smoker:  no  Health Maintenance: Pap:    10-01-16 Neg:Neg HR HPV, 05-24-13 Neg:Neg HR HPV, 2012 Neg  History of abnormal Pap:  yes,  History of abnormal with HPV, but no abnormal pap since 2007.  Abnormal records in paper chart. MMG:  07-24-21 Rt.Diag./Stable probably benign calcifications/Diag.bil in 6 months/[facility] Colonoscopy:   2018 normal--brother with polyps;next due 5 years BMD:   n/a  Result  n/a TDaP:  2019 Gardasil:   yes HIV: Neg in the past Hep C: 04-02-19 Neg Screening Labs:  PCP   reports that she has never smoked. She has never used smokeless tobacco. She reports current alcohol use of about 6.0 standard drinks of alcohol per week. She reports that she does not use drugs.  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     famotidine (PEPCID) 20 MG tablet Take by mouth.     hydrocortisone (ANUSOL-HC) 2.5 % rectal cream Place rectally.     hydrOXYzine (VISTARIL) 25 MG capsule Take by mouth.     levonorgestrel (MIRENA) 20 MCG/24HR  IUD by Intrauterine route.     lisinopril-hydrochlorothiazide (PRINZIDE,ZESTORETIC) 10-12.5 MG per tablet Take 1 tablet by mouth daily.     triamcinolone cream (KENALOG) 0.1 % Apply twice daily to active areas. Avoid face and groin     triamcinolone cream (KENALOG) 0.1 % Apply topically 2 (two) times daily.     No current facility-administered medications for this visit.    Family History  Problem Relation Age of Onset   Diabetes Father 69       Juvenile   Hyperlipidemia Father    Hypertension Father    Breast cancer Paternal Grandmother        87's or 74's   Hyperlipidemia Brother    Hypertension Brother    Hyperlipidemia Brother    Diabetes Paternal Uncle 3       juvenile    Review of Systems  Gastrointestinal:  Positive for blood in stool.  Psychiatric/Behavioral:         Stress/tension  All other systems reviewed and are negative.   Exam:   BP 130/74   Pulse 96   Ht 5\' 5"  (1.651 m)   Wt 196 lb (88.9 kg)   SpO2 98%   BMI 32.62 kg/m     General appearance: alert, cooperative and appears stated age Head: normocephalic, without obvious abnormality,  atraumatic Neck: no adenopathy, supple, symmetrical, trachea midline and thyroid normal to inspection and palpation Lungs: clear to auscultation bilaterally Breasts: normal appearance, no masses or tenderness, No nipple retraction or dimpling, No nipple discharge or bleeding, No axillary adenopathy Heart: regular rate and rhythm Abdomen: soft, non-tender; no masses, no organomegaly Extremities: extremities normal, atraumatic, no cyanosis or edema Skin: skin color, texture, turgor normal. No rashes or lesions Lymph nodes: cervical, supraclavicular, and axillary nodes normal. Neurologic: grossly normal  Pelvic: External genitalia:  no lesions              No abnormal inguinal nodes palpated.              Urethra:  normal appearing urethra with no masses, tenderness or lesions              Bartholins and Skenes: normal                  Vagina: normal appearing vagina with normal color and discharge, no lesions              Cervix: no lesions.  IUD strings noted.               Pap taken: yes Bimanual Exam:  Uterus:  normal size, contour, position, consistency, mobility, non-tender              Adnexa: no mass, fullness, tenderness              Rectal exam: yes.  Confirms.              Anus:  normal sphincter tone, no lesions  Chaperone was present for exam:  Selena Batten, CMA  Assessment:   Well woman visit with gynecologic exam. Mirena IUD. Hx CIN I around 2007. FH of colon polyps.  Hx blood in stool. HTN. Caregiver status.  Plan: Mammogram screening discussed. Self breast awareness reviewed. Pap and HR HPV as above. Guidelines for Calcium, Vitamin D, regular exercise program including cardiovascular and weight bearing exercise. Labs with PCP. She wills schedule her colonoscopy.  Follow up annually and prn.   After visit summary provided.

## 2021-11-19 NOTE — Patient Instructions (Signed)

## 2021-11-21 LAB — CYTOLOGY - PAP
Comment: NEGATIVE
Diagnosis: UNDETERMINED — AB
High risk HPV: NEGATIVE

## 2022-02-28 ENCOUNTER — Ambulatory Visit
Admission: RE | Admit: 2022-02-28 | Discharge: 2022-02-28 | Disposition: A | Discharge status: Home / Self Care | Payer: BC Managed Care – PPO | Source: Ambulatory Visit | Attending: Obstetrics and Gynecology | Admitting: Obstetrics and Gynecology

## 2022-02-28 DIAGNOSIS — R921 Mammographic calcification found on diagnostic imaging of breast: Secondary | ICD-10-CM

## 2022-05-14 IMAGING — MG MM DIGITAL DIAGNOSTIC UNILAT*R* W/ TOMO W/ CAD
6 series · 6 of 14 positions shown · non-contrast
Comparison: Previous exam(s).

CLINICAL DATA: 41-year-old female presenting for six-month
follow-up of probably benign right breast calcifications.

EXAM:
DIGITAL DIAGNOSTIC UNILATERAL RIGHT MAMMOGRAM WITH TOMOSYNTHESIS AND
CAD
TECHNIQUE: Right digital diagnostic mammography and breast tomosynthesis was
performed. The images were evaluated with computer-aided detection.

[R ML]
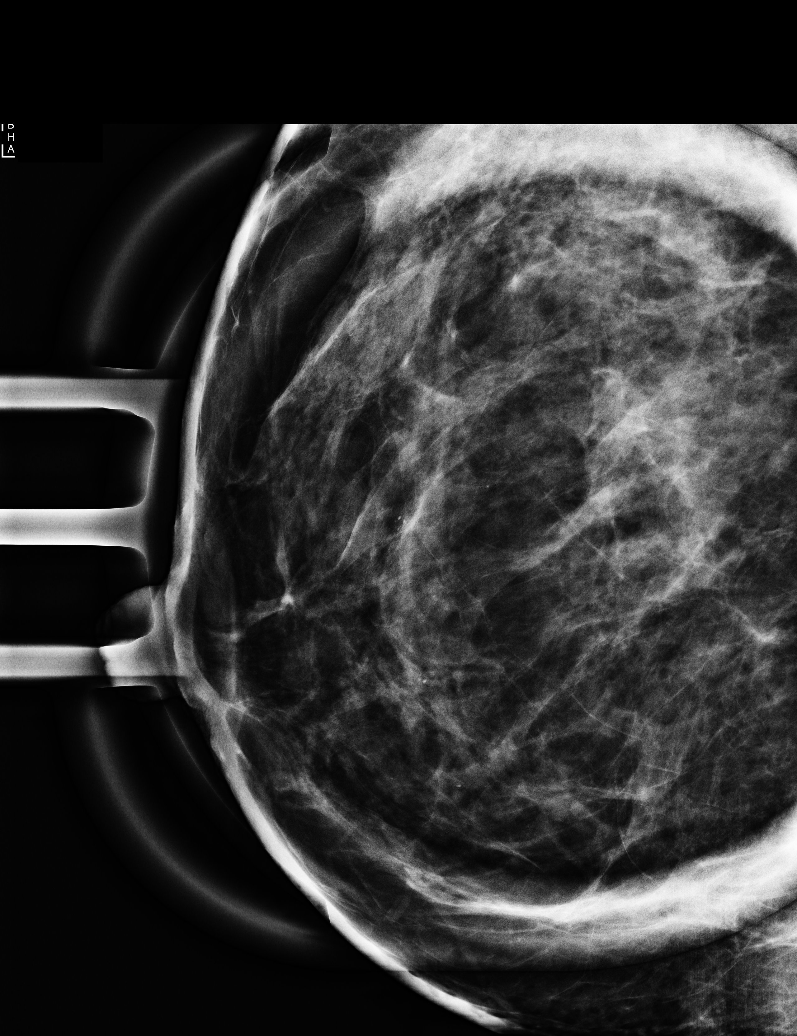

[R CC]
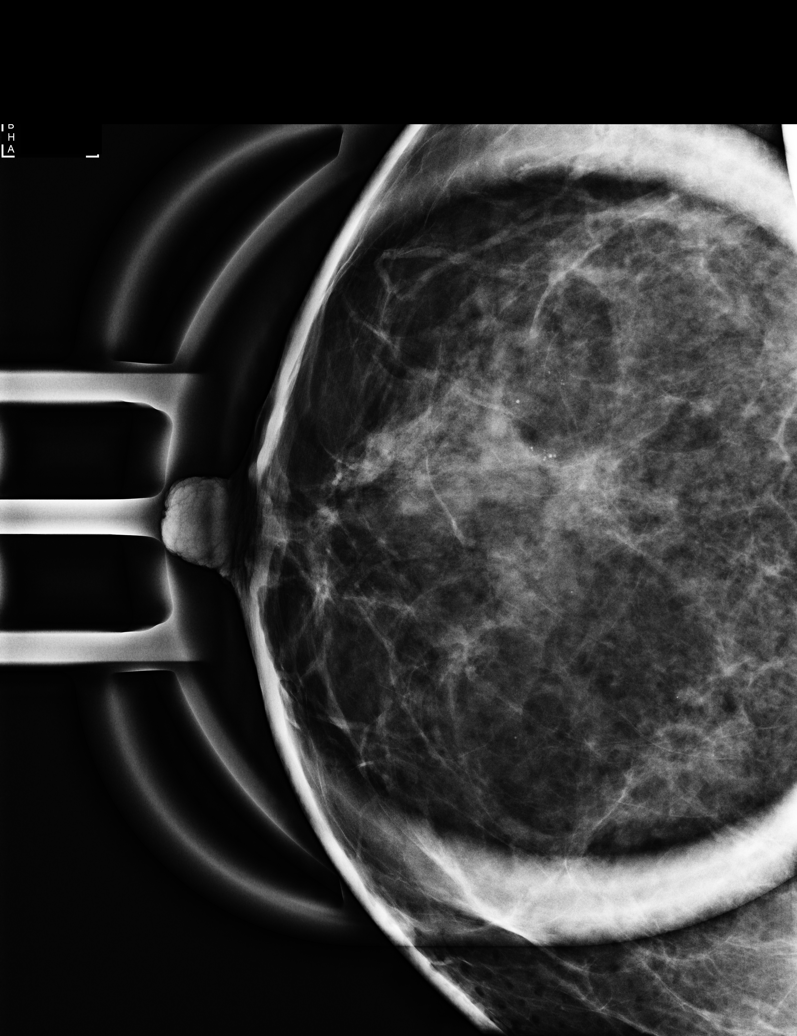

[R MLO synth-2D]
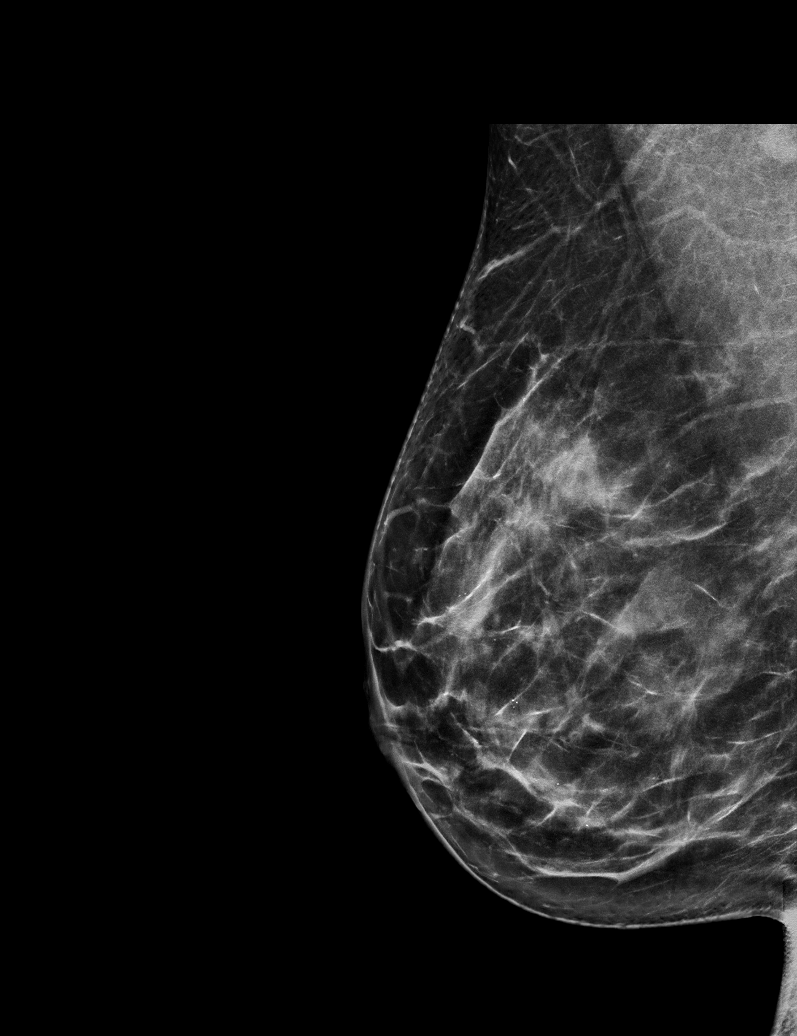

[R CC synth-2D]
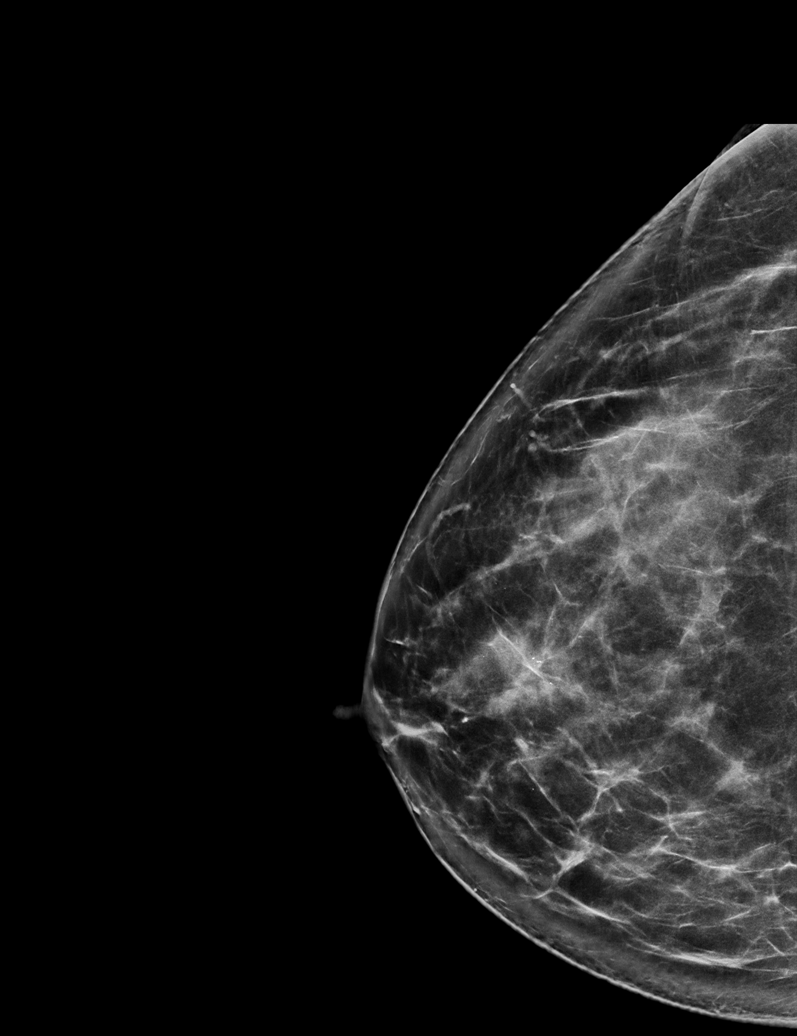

[R MLO tomo · tomo slice 37/73.0]
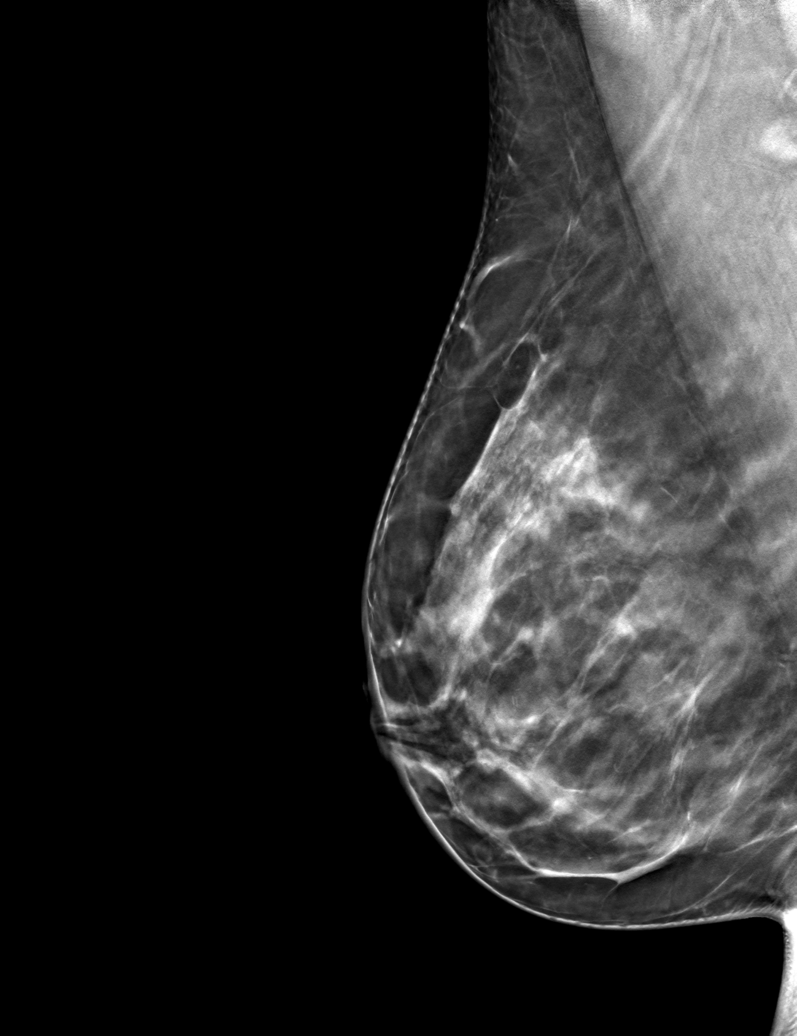

[R CC tomo · tomo slice 37/74.0]
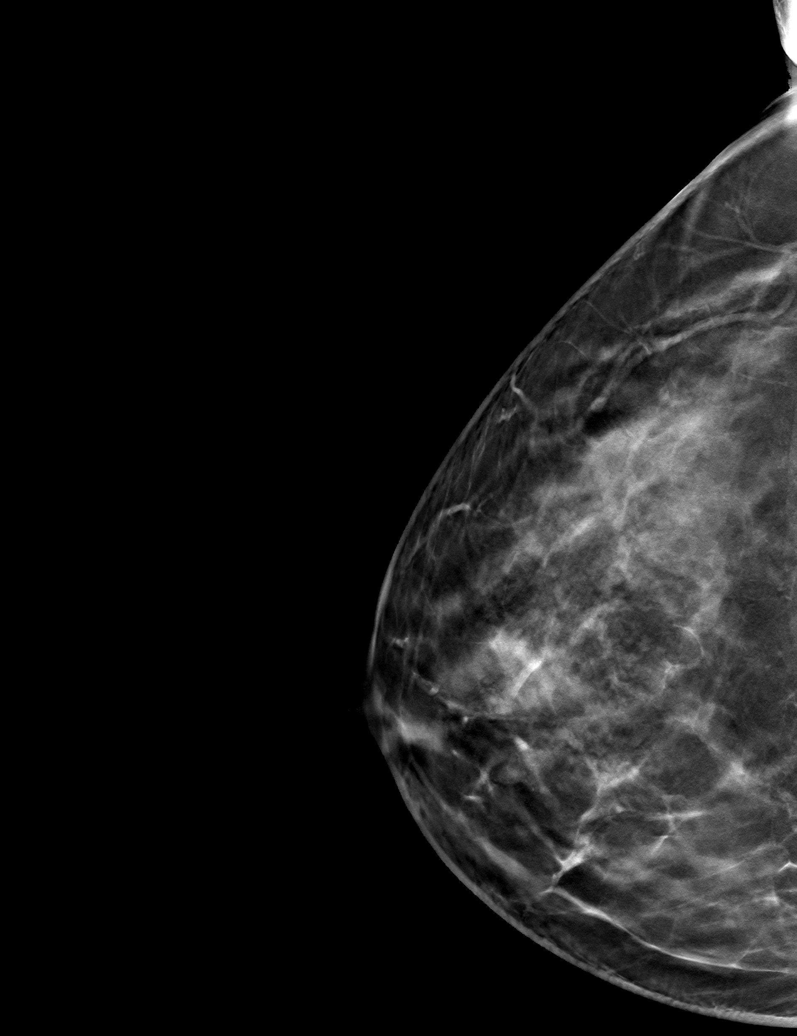

[6 of 14 positions shown; findings below may reference images not displayed]

ACR Breast Density Category c: The breast tissue is heterogeneously
dense, which may obscure small masses.
FINDINGS: Spot 2D magnification views of the right breast were performed in
addition to standard views. There are stable scattered and
occasionally grouped predominantly round calcifications in the
central right breast. No new suspicious linear or branching forms.
No new associated mass or distortion. There are no new suspicious
findings elsewhere in the right breast.
IMPRESSION: Stable probably benign calcifications in the right breast.

RECOMMENDATION:
Diagnostic bilateral mammogram in 6 months.

I have discussed the findings and recommendations with the patient.
If applicable, a reminder letter will be sent to the patient
regarding the next appointment.

BI-RADS CATEGORY  3: Probably benign.

## 2022-11-11 NOTE — Progress Notes (Deleted)
42 y.o. G55P2012 Married Caucasian female here for annual exam.    PCP:     No LMP recorded. (Menstrual status: IUD).           Sexually active: {yes no:314532}  The current method of family planning is IUD--Mirena 12/19/16.    Exercising: {yes no:314532}  {types:19826} Smoker:  no  Health Maintenance: Pap:  11/19/21 ASCUS: HR HPV neg History of abnormal Pap:  yes, History of abnormal with HPV, but no abnormal pap since 2007.  Abnormal records in paper chart.  MMG:  02/28/22 Breast Density Cat C, BI-RADS CAT 3 probably benign Colonoscopy:  2018 normal BMD:   n/a  Result  n/a TDaP:  2019 Gardasil:   yes HIV: neg in the past Hep C: 04/02/19 neg Screening Labs:  Hb today: ***, Urine today: ***   reports that she has never smoked. She has never used smokeless tobacco. She reports current alcohol use of about 6.0 standard drinks of alcohol per week. She reports that she does not use drugs.  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     famotidine (PEPCID) 20 MG tablet Take by mouth.     hydrocortisone (ANUSOL-HC) 2.5 % rectal cream Place rectally.     hydrOXYzine (VISTARIL) 25 MG capsule Take by mouth.     levonorgestrel (MIRENA) 20 MCG/24HR IUD by Intrauterine route.     lisinopril-hydrochlorothiazide (PRINZIDE,ZESTORETIC) 10-12.5 MG per tablet Take 1 tablet by mouth daily.     triamcinolone cream (KENALOG) 0.1 % Apply twice daily to active areas. Avoid face and groin     triamcinolone cream (KENALOG) 0.1 % Apply topically 2 (two) times daily.     No current facility-administered medications for this visit.    Family  History  Problem Relation Age of Onset   Diabetes Father 47       Juvenile   Hyperlipidemia Father    Hypertension Father    Breast cancer Paternal Grandmother        4's or 31's   Hyperlipidemia Brother    Hypertension Brother    Hyperlipidemia Brother    Diabetes Paternal Uncle 3       juvenile    Review of Systems  Exam:   There were no vitals taken for this visit.    General appearance: alert, cooperative and appears stated age Head: normocephalic, without obvious abnormality, atraumatic Neck: no adenopathy, supple, symmetrical, trachea midline and thyroid normal to inspection and palpation Lungs: clear to auscultation bilaterally Breasts: normal appearance, no masses or tenderness, No nipple retraction or dimpling, No nipple discharge or bleeding, No axillary adenopathy Heart: regular rate and rhythm Abdomen: soft, non-tender; no masses, no organomegaly Extremities: extremities normal, atraumatic, no cyanosis or edema Skin: skin color, texture, turgor normal. No rashes or lesions Lymph nodes: cervical, supraclavicular, and axillary nodes normal. Neurologic: grossly normal  Pelvic: External genitalia:  no lesions              No abnormal inguinal nodes palpated.  Urethra:  normal appearing urethra with no masses, tenderness or lesions              Bartholins and Skenes: normal                 Vagina: normal appearing vagina with normal color and discharge, no lesions              Cervix: no lesions              Pap taken: {yes no:314532} Bimanual Exam:  Uterus:  normal size, contour, position, consistency, mobility, non-tender              Adnexa: no mass, fullness, tenderness              Rectal exam: {yes no:314532}.  Confirms.              Anus:  normal sphincter tone, no lesions  Chaperone was present for exam:  ***  Assessment:   Well woman visit with gynecologic exam.   Plan: Mammogram screening discussed. Self breast awareness reviewed. Pap  and HR HPV as above. Guidelines for Calcium, Vitamin D, regular exercise program including cardiovascular and weight bearing exercise.   Follow up annually and prn.   Additional counseling given.  {yes [id number]. _______ minutes face to face time of which over 50% was spent in counseling.    After visit summary provided.

## 2022-11-25 ENCOUNTER — Ambulatory Visit: Payer: BC Managed Care – PPO | Admitting: Obstetrics and Gynecology

## 2023-01-06 ENCOUNTER — Other Ambulatory Visit: Payer: Self-pay | Admitting: Obstetrics and Gynecology

## 2023-01-06 DIAGNOSIS — R921 Mammographic calcification found on diagnostic imaging of breast: Secondary | ICD-10-CM

## 2023-01-06 NOTE — Progress Notes (Unsigned)
42 y.o. G32P2012 Married Caucasian female here for annual exam.    Some increased bleeding the last couple of years with her Mirena.  Had more of a period in July and August.   No rectal bleeding.   Son is doing well.  He has special needs and attends school.  PCP:   Daylene Katayama, PA  No LMP recorded. (Menstrual status: IUD).           Sexually active: Yes.    The current method of family planning is IUD--Mirena 12/19/16.    Exercising: Yes.     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  Smoker:  no  Health Maintenance: Pap:  11/19/21 ASCUS: HR HPV neg, 10/01/16 neg History of abnormal Pap:  yes, History of abnormal with HPV, but no abnormal pap since 2007.  Abnormal records in paper chart.  MMG:  02/28/22 Breast Density Cat C, BI-RADS CAT 3 probably benign.  Has appointment. Colonoscopy:  2019, due 2024.  She will have her PCP make the referral.  BMD:   n/a  Result  n/a TDaP:  2019. Gardasil:   no HIV: neg in the past Hep C: 04/02/19 neg Screening Labs:  PCP     reports that she has never smoked. She has never used smokeless tobacco. She reports current alcohol use of about 6.0 standard drinks of alcohol per week. She reports that she does not use drugs.  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     hydrOXYzine (VISTARIL) 25 MG capsule Take by mouth.     levonorgestrel (MIRENA) 20 MCG/24HR IUD by Intrauterine route.     lisinopril-hydrochlorothiazide (PRINZIDE,ZESTORETIC) 10-12.5 MG per tablet Take 1 tablet by mouth daily.     triamcinolone cream (KENALOG) 0.1 % Apply twice daily to active areas. Avoid face and  groin     triamcinolone cream (KENALOG) 0.1 % Apply topically 2 (two) times daily.     No current facility-administered medications for this visit.    Family History  Problem Relation Age of Onset   Diabetes Father 54       Juvenile   Hyperlipidemia Father    Hypertension Father    Breast cancer Paternal Grandmother        11's or 55's   Hyperlipidemia Brother    Hypertension Brother    Hyperlipidemia Brother    Diabetes Paternal Uncle 3       juvenile    Review of Systems  All other systems reviewed and are negative.   Exam:   BP 136/82 (BP Location: Right Arm, Patient Position: Sitting, Cuff Size: Normal)   Pulse 81   Ht 5\' 5"  (1.651 m)   Wt 179 lb (81.2 kg)   SpO2 98%   BMI 29.79 kg/m     General appearance: alert, cooperative and appears stated age Head: normocephalic, without obvious abnormality, atraumatic Neck: no adenopathy, supple, symmetrical, trachea midline and thyroid normal to inspection and palpation Lungs: clear to auscultation bilaterally Breasts: normal appearance, no masses or tenderness, No nipple retraction or dimpling, No nipple discharge or  bleeding, No axillary adenopathy Heart: regular rate and rhythm Abdomen: soft, non-tender; no masses, no organomegaly Extremities: extremities normal, atraumatic, no cyanosis or edema Skin: skin color, texture, turgor normal. No rashes or lesions Lymph nodes: cervical, supraclavicular, and axillary nodes normal. Neurologic: grossly normal  Pelvic: External genitalia:  no lesions              No abnormal inguinal nodes palpated.              Urethra:  normal appearing urethra with no masses, tenderness or lesions              Bartholins and Skenes: normal                 Vagina: normal appearing vagina with normal color and discharge, no lesions              Cervix: no lesions  IUD strings noted.               Pap taken: no Bimanual Exam:  Uterus:  normal size, contour, position, consistency, mobility,  non-tender              Adnexa: no mass, fullness, tenderness              Rectal exam: yes.  Confirms.              Anus:  normal sphincter tone, no lesions  Chaperone was present for exam:  Antony Salmon, CMA  Assessment:   Well woman visit with gynecologic exam. Mirena IUD.  Removal due in 2026.  Pap ASCUS, neg HR HPV in 2023.  Hx CIN I around 2007. FH of colon polyps.  HTN. Caregiver status.  Plan: Mammogram screening discussed. Self breast awareness reviewed. Pap and HR HPV 2026. Guidelines for Calcium, Vitamin D, regular exercise program including cardiovascular and weight bearing exercise. Follow up annually and prn.

## 2023-01-16 ENCOUNTER — Ambulatory Visit
Admission: RE | Admit: 2023-01-16 | Discharge: 2023-01-16 | Disposition: A | Discharge status: Home / Self Care | Payer: BC Managed Care – PPO | Source: Ambulatory Visit | Attending: Obstetrics and Gynecology | Admitting: Obstetrics and Gynecology

## 2023-01-16 ENCOUNTER — Other Ambulatory Visit: Payer: Self-pay | Admitting: Obstetrics and Gynecology

## 2023-01-16 DIAGNOSIS — R921 Mammographic calcification found on diagnostic imaging of breast: Secondary | ICD-10-CM

## 2023-01-20 ENCOUNTER — Encounter: Payer: Self-pay | Admitting: Obstetrics and Gynecology

## 2023-01-20 ENCOUNTER — Ambulatory Visit (INDEPENDENT_AMBULATORY_CARE_PROVIDER_SITE_OTHER): Payer: BC Managed Care – PPO | Admitting: Obstetrics and Gynecology

## 2023-01-20 VITALS — BP 136/82 | HR 81 | Ht 65.0 in | Wt 179.0 lb

## 2023-01-20 DIAGNOSIS — Z01419 Encounter for gynecological examination (general) (routine) without abnormal findings: Secondary | ICD-10-CM

## 2023-01-20 NOTE — Patient Instructions (Signed)

## 2023-03-05 ENCOUNTER — Ambulatory Visit
Admission: RE | Admit: 2023-03-05 | Discharge: 2023-03-05 | Disposition: A | Discharge status: Home / Self Care | Payer: BC Managed Care – PPO | Source: Ambulatory Visit | Attending: Obstetrics and Gynecology | Admitting: Obstetrics and Gynecology

## 2024-02-11 ENCOUNTER — Other Ambulatory Visit: Payer: Self-pay | Admitting: Obstetrics and Gynecology

## 2024-02-11 DIAGNOSIS — R921 Mammographic calcification found on diagnostic imaging of breast: Secondary | ICD-10-CM

## 2024-02-24 ENCOUNTER — Encounter

## 2024-03-05 ENCOUNTER — Encounter

## 2024-03-12 ENCOUNTER — Ambulatory Visit
Admission: RE | Admit: 2024-03-12 | Discharge: 2024-03-12 | Disposition: A | Discharge status: Home / Self Care | Source: Ambulatory Visit | Attending: Obstetrics and Gynecology | Admitting: Obstetrics and Gynecology

## 2024-03-12 DIAGNOSIS — R921 Mammographic calcification found on diagnostic imaging of breast: Secondary | ICD-10-CM

## 2024-03-14 ENCOUNTER — Ambulatory Visit: Payer: Self-pay | Admitting: Obstetrics and Gynecology

## 2024-09-29 ENCOUNTER — Ambulatory Visit: Payer: Self-pay | Admitting: Obstetrics and Gynecology
